# Patient Record
Sex: Male | Born: 1985 | Race: White | Hispanic: No | Marital: Married
Health system: Southern US, Community
[De-identification: ages and names within clinical notes are randomized; demographics above are authoritative.]

## PROBLEM LIST (undated history)

## (undated) DIAGNOSIS — N200 Calculus of kidney: Secondary | ICD-10-CM

---

## 2019-03-08 ENCOUNTER — Other Ambulatory Visit: Payer: Self-pay

## 2019-03-08 ENCOUNTER — Emergency Department (HOSPITAL_COMMUNITY)
Admission: EM | Admit: 2019-03-08 | Discharge: 2019-03-08 | Disposition: A | Discharge status: Home / Self Care | Payer: Medicaid Other | Attending: Emergency Medicine | Admitting: Emergency Medicine

## 2019-03-08 ENCOUNTER — Encounter (HOSPITAL_COMMUNITY): Payer: Self-pay | Admitting: Emergency Medicine

## 2019-03-08 ENCOUNTER — Emergency Department (HOSPITAL_COMMUNITY): Payer: Medicaid Other

## 2019-03-08 DIAGNOSIS — N201 Calculus of ureter: Secondary | ICD-10-CM | POA: Diagnosis not present

## 2019-03-08 DIAGNOSIS — R109 Unspecified abdominal pain: Secondary | ICD-10-CM | POA: Diagnosis present

## 2019-03-08 HISTORY — DX: Calculus of kidney: N20.0

## 2019-03-08 LAB — URINALYSIS, ROUTINE W REFLEX MICROSCOPIC
Bilirubin Urine: NEGATIVE
Glucose, UA: 50 mg/dL — AB
Ketones, ur: 5 mg/dL — AB
Leukocytes,Ua: NEGATIVE
Nitrite: NEGATIVE
Protein, ur: 100 mg/dL — AB
RBC / HPF: >50 RBC/hpf — ABNORMAL HIGH (ref 0–5)
Specific Gravity, Urine: 1.03 (ref 1.005–1.030)
pH: 6 (ref 5.0–8.0)

## 2019-03-08 LAB — CBC WITH DIFFERENTIAL/PLATELET
Abs Immature Granulocytes: 0.04 10*3/uL (ref 0.00–0.07)
Basophils Absolute: 0 10*3/uL (ref 0.0–0.1)
Basophils Relative: 0 %
Eosinophils Absolute: 0 10*3/uL (ref 0.0–0.5)
Eosinophils Relative: 0 %
HCT: 49.5 % (ref 39.0–52.0)
Hemoglobin: 16.9 g/dL (ref 13.0–17.0)
Immature Granulocytes: 0 %
Lymphocytes Relative: 12 %
Lymphs Abs: 1.4 10*3/uL (ref 0.7–4.0)
MCH: 29.8 pg (ref 26.0–34.0)
MCHC: 34.1 g/dL (ref 30.0–36.0)
MCV: 87.3 fL (ref 80.0–100.0)
Monocytes Absolute: 0.5 10*3/uL (ref 0.1–1.0)
Monocytes Relative: 4 %
Neutro Abs: 9.7 10*3/uL — ABNORMAL HIGH (ref 1.7–7.7)
Neutrophils Relative %: 84 %
Platelets: 248 10*3/uL (ref 150–400)
RBC: 5.67 MIL/uL (ref 4.22–5.81)
RDW: 12.8 % (ref 11.5–15.5)
WBC: 11.6 10*3/uL — ABNORMAL HIGH (ref 4.0–10.5)
nRBC: 0 % (ref 0.0–0.2)

## 2019-03-08 LAB — BASIC METABOLIC PANEL
Anion gap: 11 (ref 5–15)
BUN: 14 mg/dL (ref 6–20)
CO2: 20 mmol/L — ABNORMAL LOW (ref 22–32)
Calcium: 9.5 mg/dL (ref 8.9–10.3)
Chloride: 109 mmol/L (ref 98–111)
Creatinine, Ser: 1.1 mg/dL (ref 0.61–1.24)
GFR calc Af Amer: >60 mL/min (ref >60)
GFR calc non Af Amer: >60 mL/min (ref >60)
Glucose, Bld: 127 mg/dL — ABNORMAL HIGH (ref 70–99)
Potassium: 4 mmol/L (ref 3.5–5.1)
Sodium: 140 mmol/L (ref 135–145)

## 2019-03-08 MED ORDER — ONDANSETRON HCL 4 MG/2ML IJ SOLN
4.0000 mg | Freq: Once | INTRAMUSCULAR | Status: AC
  Administered 2019-03-08: 4 mg via INTRAVENOUS
  Filled 2019-03-08: qty 2

## 2019-03-08 MED ORDER — KETOROLAC TROMETHAMINE 10 MG PO TABS: 10.0000 mg | ORAL_TABLET | Freq: Four times a day (QID) | ORAL | 0 refills | Status: AC | PRN

## 2019-03-08 MED ORDER — TAMSULOSIN HCL 0.4 MG PO CAPS: 0.4000 mg | ORAL_CAPSULE | Freq: Every day | ORAL | 0 refills | Status: AC

## 2019-03-08 MED ORDER — KETOROLAC TROMETHAMINE 30 MG/ML IJ SOLN
30.0000 mg | Freq: Once | INTRAMUSCULAR | Status: AC
  Administered 2019-03-08: 30 mg via INTRAVENOUS
  Filled 2019-03-08: qty 1

## 2019-03-08 MED ORDER — OXYCODONE-ACETAMINOPHEN 5-325 MG PO TABS: 1.0000 | ORAL_TABLET | Freq: Four times a day (QID) | ORAL | 0 refills | Status: AC | PRN

## 2019-03-08 NOTE — Discharge Instructions (Addendum)
Please read attached information. If you experience any new or worsening signs or symptoms please return to the emergency room for evaluation. Please follow-up with your primary care provider or specialist as discussed. Please use medication prescribed only as directed and discontinue taking if you have any concerning signs or symptoms.   °

## 2019-03-08 NOTE — ED Provider Notes (Signed)
[facility] EMERGENCY DEPARTMENT Provider Note   CSN: [id number] Arrival date & time: 03/08/19  1055    History   Chief Complaint No chief complaint on file.   HPI Terry Obrien is a 33 y.o. male.     HPI   33 year old male presents today with complaints of right-sided flank pain.  Patient notes symptoms started at 6 AM this morning with acute severe sharp pain in his right flank.  Denies any lower abdominal pain, notes nausea and vomiting, denies any fever.  No urinary complaints.  He notes history of kidney stone and reports this feels similar.  He has not had a stone in over 1 year.   Past Medical History:  Diagnosis Date  . Kidney stones     There are no active problems to display for this patient.      Home Medications    Prior to Admission medications   Medication Sig Start Date End Date Taking? Authorizing Provider  ketorolac (TORADOL) 10 MG tablet Take 1 tablet (10 mg total) by mouth every 6 (six) hours as needed. 03/08/19   Kateria Cutrona, Dellis Filbert, PA-C  oxyCODONE-acetaminophen (PERCOCET/ROXICET) 5-325 MG tablet Take 1 tablet by mouth every 6 (six) hours as needed for severe pain. 03/08/19   Diara Chaudhari, Dellis Filbert, PA-C  tamsulosin (FLOMAX) 0.4 MG CAPS capsule Take 1 capsule (0.4 mg total) by mouth daily. 03/08/19   Okey Regal, PA-C    Family History No family history on file.  Social History Social History   Tobacco Use  . Smoking status: Not on file  Substance Use Topics  . Alcohol use: Not on file  . Drug use: Not on file     Allergies   Patient has no known allergies.   Review of Systems Review of Systems  All other systems reviewed and are negative.   Physical Exam Updated Vital Signs BP 133/90 (BP Location: Left Arm)   Pulse (!) 55   Temp 98.2 F (36.8 C) (Oral)   Resp 17   SpO2 99%   Physical Exam Vitals signs and nursing note reviewed.  Constitutional:      Appearance: He is well-developed.  HENT:     Head:  Normocephalic and atraumatic.  Eyes:     General: No scleral icterus.       Right eye: No discharge.        Left eye: No discharge.     Conjunctiva/sclera: Conjunctivae normal.     Pupils: Pupils are equal, round, and reactive to light.  Neck:     Musculoskeletal: Normal range of motion.     Vascular: No JVD.     Trachea: No tracheal deviation.  Pulmonary:     Effort: Pulmonary effort is normal.     Breath sounds: No stridor.  Abdominal:     General: There is no distension.     Palpations: Abdomen is soft.     Tenderness: There is no abdominal tenderness.     Comments: No CVA tenderness  Neurological:     Mental Status: He is alert and oriented to person, place, and time.     Coordination: Coordination normal.  Psychiatric:        Behavior: Behavior normal.        Thought Content: Thought content normal.        Judgment: Judgment normal.     ED Treatments / Results  Labs (all labs ordered are listed, but only abnormal results are displayed) Labs Reviewed  CBC WITH  DIFFERENTIAL/PLATELET - Abnormal; Notable for the following components:      Result Value   WBC 11.6 (*)    Neutro Abs 9.7 (*)    All other components within normal limits  BASIC METABOLIC PANEL - Abnormal; Notable for the following components:   CO2 20 (*)    Glucose, Bld 127 (*)    All other components within normal limits  URINALYSIS, ROUTINE W REFLEX MICROSCOPIC - Abnormal; Notable for the following components:   Color, Urine AMBER (*)    APPearance CLOUDY (*)    Glucose, UA 50 (*)    Hgb urine dipstick LARGE (*)    Ketones, ur 5 (*)    Protein, ur 100 (*)    RBC / HPF >50 (*)    Bacteria, UA RARE (*)    All other components within normal limits   EKG None  Radiology Ct Renal Stone Study  Result Date: 03/08/2019 CLINICAL DATA:  Right flank pain since this morning. EXAM: CT ABDOMEN AND PELVIS WITHOUT CONTRAST TECHNIQUE: Multidetector CT imaging of the abdomen and pelvis was performed following  the standard protocol without IV contrast. COMPARISON:  None. FINDINGS: Lower chest: No acute abnormality. Hepatobiliary: The liver is normal. There is no focal liver lesion. A small stone is identified in the gallbladder. No inflammation is noted around gallbladder. The biliary tree is normal. Pancreas: Unremarkable. No pancreatic ductal dilatation or surrounding inflammatory changes. Spleen: Normal in size without focal abnormality. Adrenals/Urinary Tract: The adrenal glands are normal bilaterally. There is right hydronephrosis due to obstruction by 2 mm stone in the mid right ureter. There is probably tiny nonobstructing stone in the left kidney. There is no left hydronephrosis. The bladder is normal. Stomach/Bowel: Stomach is within normal limits. Appendix appears normal. No evidence of bowel wall thickening, distention, or inflammatory changes. Vascular/Lymphatic: No significant vascular findings are present. No enlarged abdominal or pelvic lymph nodes. Reproductive: Prostate is unremarkable. Other: None Musculoskeletal: No acute or significant osseous findings. IMPRESSION: Right hydronephrosis due to obstruction by 2 mm stone in the mid right ureter. Electronically Signed   By: Sherian ReinWei-Chen  Lin M.D.   On: 03/08/2019 12:59    Procedures Procedures (including critical care time)  Medications Ordered in ED Medications  ketorolac (TORADOL) 30 MG/ML injection 30 mg (30 mg Intravenous Given 03/08/19 1158)  ondansetron (ZOFRAN) injection 4 mg (4 mg Intravenous Given 03/08/19 1158)     Initial Impression / Assessment and Plan / ED Course  I have reviewed the triage vital signs and the nursing notes.  Pertinent labs & imaging results that were available during my care of the patient were reviewed by me and considered in my medical decision making (see chart for details).          Assessment/Plan: 89 YOM with ureterolithiasis no signs of infection.  This is a 2 mm stone will likely pass on its own.   Patient placed on pain medication discharged with follow-up to his urologist.  Return precautions given.  Verbalized understanding and agreement to today's plan had no further questions or concerns the time discharge.   Final Clinical Impressions(s) / ED Diagnoses   Final diagnoses:  Ureterolithiasis    ED Discharge Orders         Ordered    tamsulosin (FLOMAX) 0.4 MG CAPS capsule  Daily     03/08/19 1359    oxyCODONE-acetaminophen (PERCOCET/ROXICET) 5-325 MG tablet  Every 6 hours PRN     03/08/19 1359    ketorolac (TORADOL)  10 MG tablet  Every 6 hours PRN     03/08/19 1359           Rosalio LoudHedges, Roxan Yamamoto, PA-C 03/08/19 1847    Tilden Fossaees, Elizabeth, MD 03/09/19 [phone]

## 2019-03-08 NOTE — ED Triage Notes (Signed)
Patient c/o right sided flank pain onset of this am. Patient diaphoretic and vomiting.

## 2019-03-08 NOTE — ED Notes (Signed)
Walked patient to the bathroom patient did well 

## 2019-03-08 NOTE — ED Notes (Signed)
Pt transported to CT ?

## 2021-01-11 IMAGING — CT CT RENAL STONE PROTOCOL
2 of 4 series · 16 of 46 positions shown, 18 images · non-contrast
Comparison: None.

CLINICAL DATA: Right flank pain since this morning.

EXAM:
CT ABDOMEN AND PELVIS WITHOUT CONTRAST
TECHNIQUE: Multidetector CT imaging of the abdomen and pelvis was performed
following the standard protocol without IV contrast.

[Series 3: renal stone 5.0 · axial · 0.98mm/px · z∈[+606,+1111]mm · 13 of 111 slices shown, 15 images]
[im 5/111  soft-tissue]
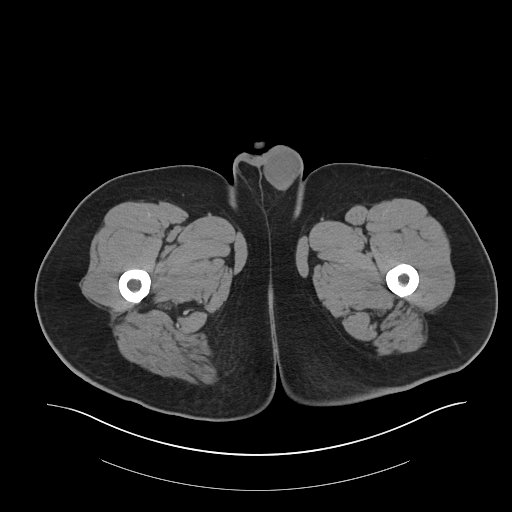
[im 5/111  bone]
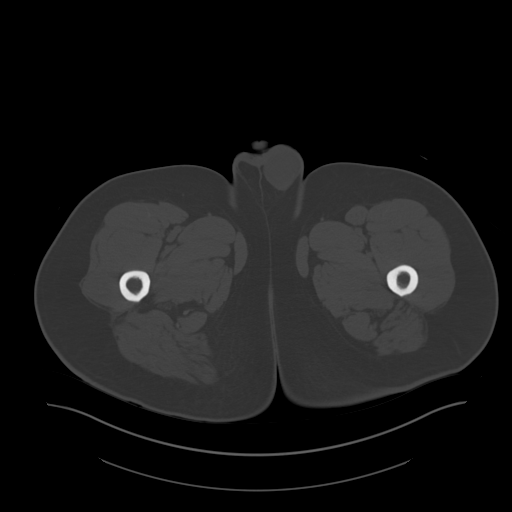
[im 15/111  soft-tissue]
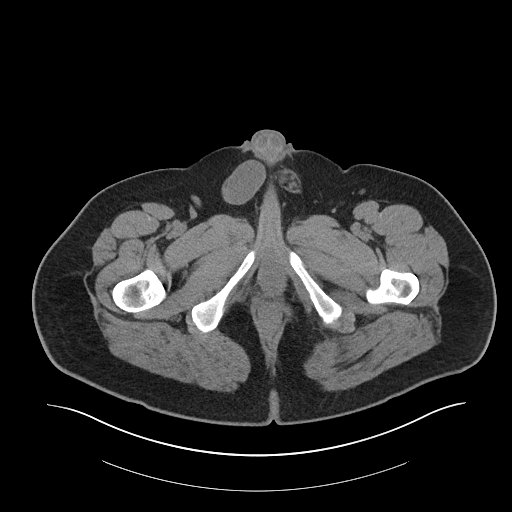
[im 24/111  soft-tissue]
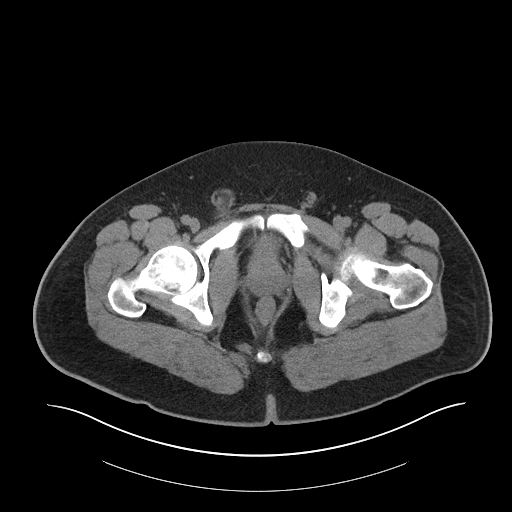
[im 29/111  soft-tissue]
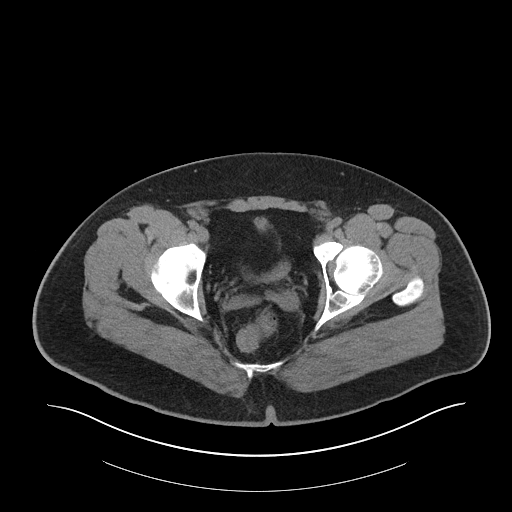
[im 39/111  soft-tissue]
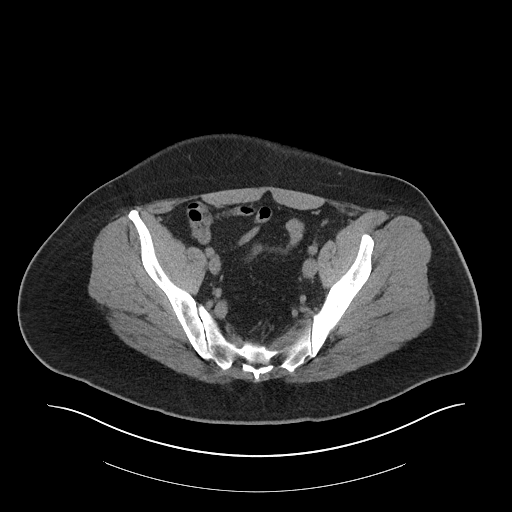
[im 48/111  soft-tissue]
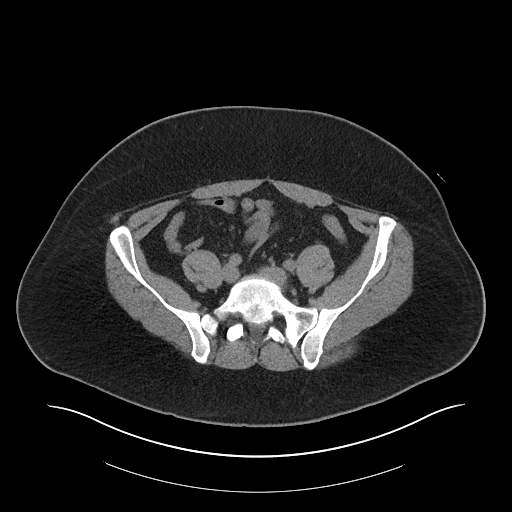
[im 58/111  soft-tissue]
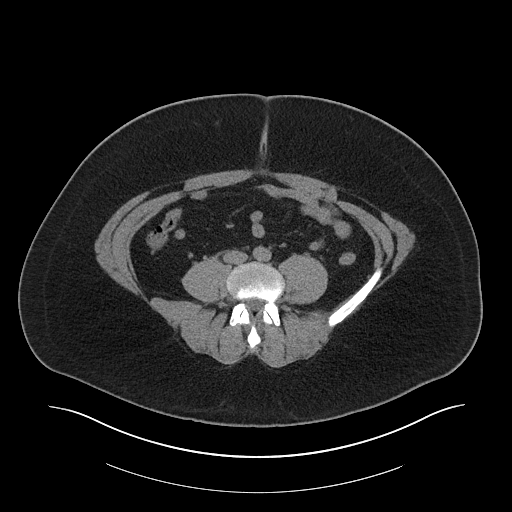
[im 63/111  soft-tissue]
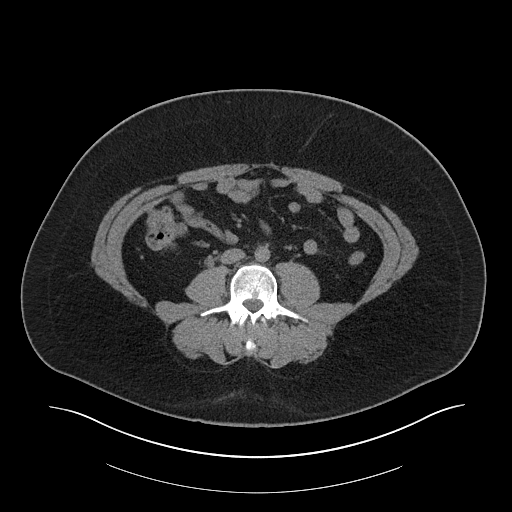
[im 72/111  soft-tissue]
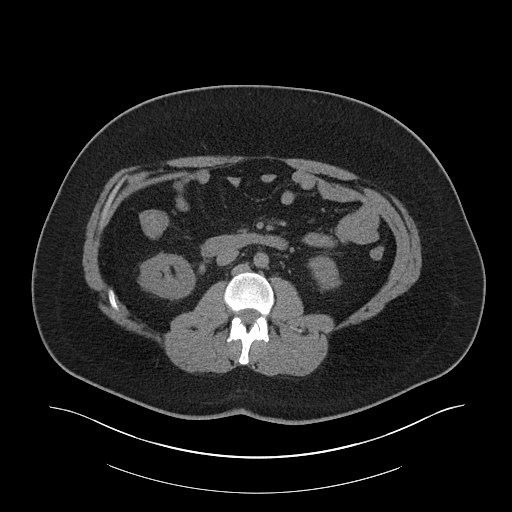
[im 72/111  bone]
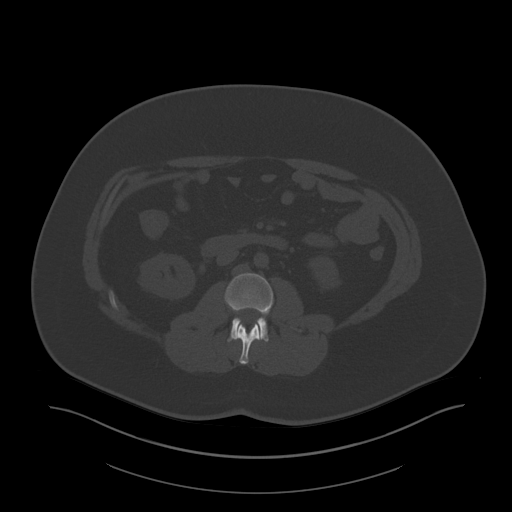
[im 82/111  soft-tissue]
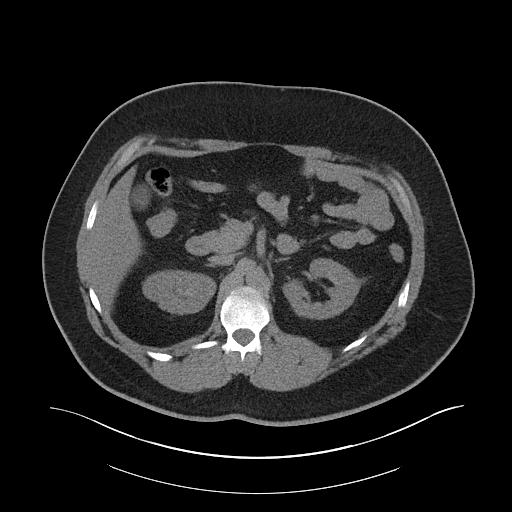
[im 87/111  soft-tissue]
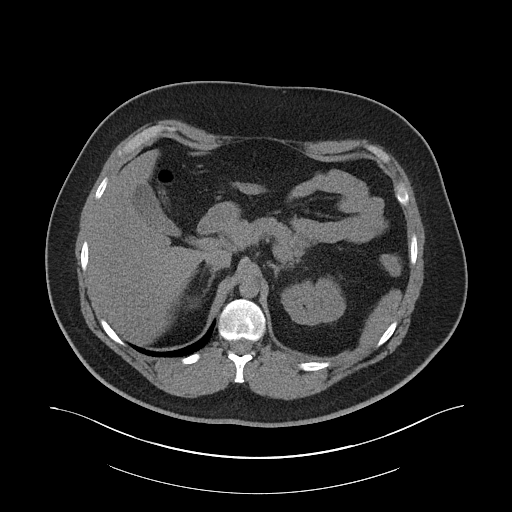
[im 96/111  soft-tissue]
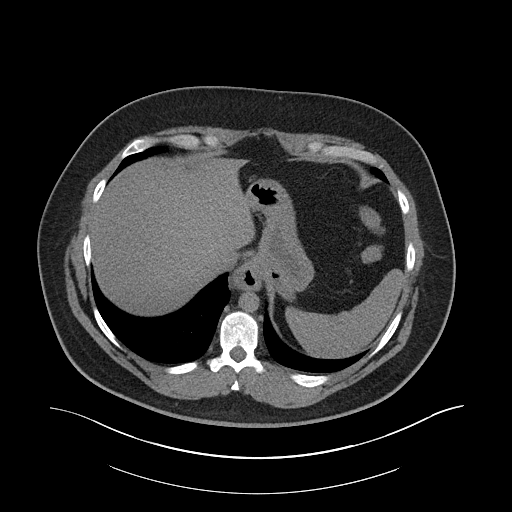
[im 106/111  soft-tissue]
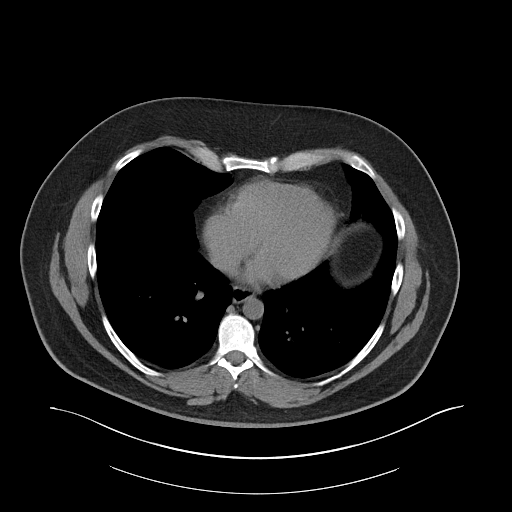

[Series 6: renal stone 3.0 cor · coronal · 0.97mm/px · 3 of 113 slices shown]
[im 38/113  soft-tissue]
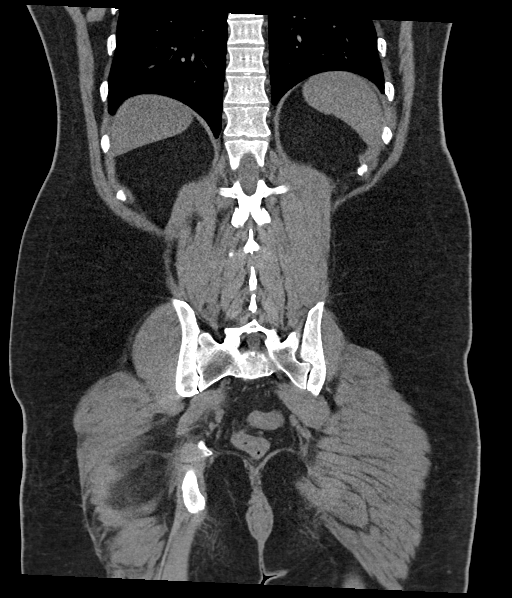
[im 50/113  soft-tissue]
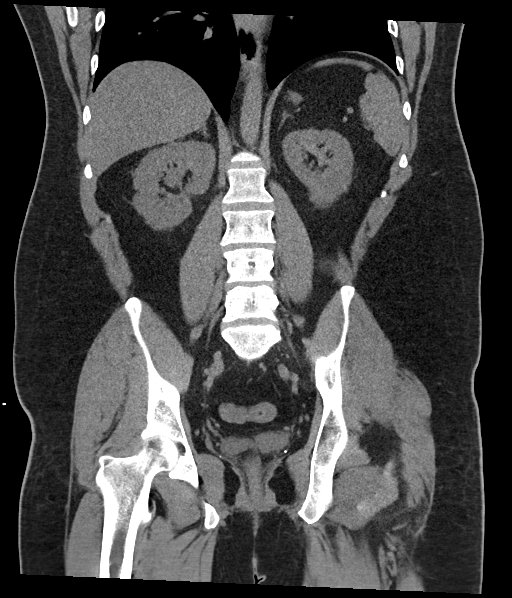
[im 63/113  soft-tissue]
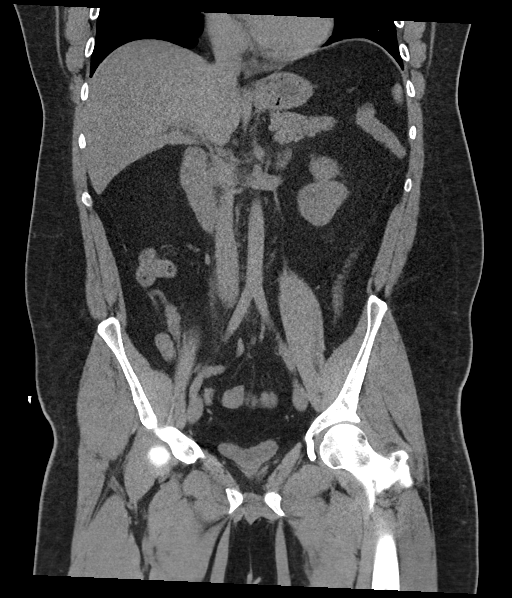

[16 of 46 positions shown; findings below may reference images not displayed]

FINDINGS: Lower chest: No acute abnormality.

Hepatobiliary: The liver is normal. There is no focal liver lesion.
A small stone is identified in the gallbladder. No inflammation is
noted around gallbladder. The biliary tree is normal.

Pancreas: Unremarkable. No pancreatic ductal dilatation or
surrounding inflammatory changes.

Spleen: Normal in size without focal abnormality.

Adrenals/Urinary Tract: The adrenal glands are normal bilaterally.
There is right hydronephrosis due to obstruction by 2 mm stone in
the mid right ureter. There is probably tiny nonobstructing stone in
the left kidney. There is no left hydronephrosis. The bladder is
normal.

Stomach/Bowel: Stomach is within normal limits. Appendix appears
normal. No evidence of bowel wall thickening, distention, or
inflammatory changes.

Vascular/Lymphatic: No significant vascular findings are present. No
enlarged abdominal or pelvic lymph nodes.

Reproductive: Prostate is unremarkable.

Other: None

Musculoskeletal: No acute or significant osseous findings.
IMPRESSION: Right hydronephrosis due to obstruction by 2 mm stone in the mid
right ureter.
